# Patient Record
Sex: Male | Born: 1995 | Race: White | Hispanic: No | Marital: Single | Smoking: Former smoker
Health system: Southern US, Community
[De-identification: ages and names within clinical notes are randomized; demographics above are authoritative.]

## PROBLEM LIST (undated history)

## (undated) DIAGNOSIS — F909 Attention-deficit hyperactivity disorder, unspecified type: Secondary | ICD-10-CM

## (undated) DIAGNOSIS — K859 Acute pancreatitis without necrosis or infection, unspecified: Secondary | ICD-10-CM

## (undated) DIAGNOSIS — K5190 Ulcerative colitis, unspecified, without complications: Secondary | ICD-10-CM

## (undated) HISTORY — DX: Acute pancreatitis without necrosis or infection, unspecified: K85.90

---
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
## 2023-05-27 ENCOUNTER — Other Ambulatory Visit: Payer: Self-pay

## 2023-05-27 ENCOUNTER — Encounter (HOSPITAL_COMMUNITY): Payer: Self-pay

## 2023-05-27 ENCOUNTER — Emergency Department (HOSPITAL_COMMUNITY)
Admission: EM | Admit: 2023-05-27 | Discharge: 2023-05-27 | Disposition: A | Discharge status: Home / Self Care | Attending: Emergency Medicine | Admitting: Emergency Medicine

## 2023-05-27 DIAGNOSIS — L03211 Cellulitis of face: Secondary | ICD-10-CM | POA: Insufficient documentation

## 2023-05-27 DIAGNOSIS — L0201 Cutaneous abscess of face: Secondary | ICD-10-CM | POA: Insufficient documentation

## 2023-05-27 DIAGNOSIS — K130 Diseases of lips: Secondary | ICD-10-CM | POA: Diagnosis present

## 2023-05-27 DIAGNOSIS — L0291 Cutaneous abscess, unspecified: Secondary | ICD-10-CM

## 2023-05-27 MED ORDER — LIDOCAINE-PRILOCAINE 2.5-2.5 % EX CREA
TOPICAL_CREAM | Freq: Once | CUTANEOUS | Status: AC
Start: 2023-05-27 — End: 2023-05-27
  Administered 2023-05-27: 1 via TOPICAL
  Filled 2023-05-27: qty 5

## 2023-05-27 NOTE — Discharge Instructions (Signed)
You have an area of infected skin called cellulitis.  I have started you on antibiotics.  These antibiotics do not work instantaneously.   - You should allow at least 48 hours after your first dose for the cellulitis to slow it's spreading.   - You should allow 48 hours after the first dose to see the cellulitis to start improving.   - These things may happen quicker than that but allow at least these limitations prior to returning to the emergency department or your primary doctor.   - The exceptions to this are if you become systemically ill such as having a fever, nausea, vomiting, malaise.  - The other exception is if you have a rapid spreading of the cellulitis or the redness and swelling and pain.  If you notice more than a couple inches of spreading over a couple hours you need to return to the emergency department immediately because this could be a life-threatening infection. If you notice red streaking from the site you also need to return for reevaluation.  - If not, follow up with your primary doctor as instructed for reevaluation.   

## 2023-05-27 NOTE — ED Triage Notes (Signed)
Pt arrived via POV c/o persistent abscess/cyst to his lower lip that he first noticed yesterday. Pt reports he went to [facility] and was prescribed a PO Bactrim and an ointment. Pt reports he has taken 2 rounds of his prescription w/o any relief. Pt reports the cyst feels like a burning pressure.

## 2023-05-27 NOTE — ED Provider Notes (Signed)
[name] Provider Note   CSN: [id number] Arrival date & time: 05/27/23  0217     History  Chief Complaint  Patient presents with   Abscess    Bryan Charles is a 27 y.o. male.  27 year old male who presents ER today with swelling to his lower lip.  Patient states has been there for couple days he went to urgent care was started on some type of ointment along with p.o. Bactrim.  States has not really improved much.  He states he drained some pus from the earlier today that was yellow and thick.  No risk factors for herpes.   Abscess      Home Medications Prior to Admission medications   Medication Sig Start Date End Date Taking? Authorizing Provider  amphetamine-dextroamphetamine (ADDERALL) 15 MG tablet Take 15 mg by mouth daily. 04/25/23  Yes [provider]  mesalamine (CANASA) 1000 MG suppository Place 1,000 mg rectally at bedtime. 04/04/23  Yes [provider]  mesalamine (LIALDA) 1.2 g EC tablet Take 4.8 g by mouth daily. 04/18/23  Yes [provider]  mupirocin ointment (BACTROBAN) 2 % Apply 1 Application topically 2 (two) times daily. 05/26/23  Yes [provider]  sulfamethoxazole-trimethoprim (BACTRIM DS) 800-160 MG tablet Take 1 tablet by mouth 2 (two) times daily. 05/26/23  Yes [provider]  cephALEXin (KEFLEX) 500 MG capsule Take 2 capsules (1,000 mg total) by mouth 2 (two) times daily. 04/11/14   Burgess Amor, PA-C      Allergies    Patient has no known allergies.    Review of Systems   Review of Systems  Physical Exam Updated Vital Signs BP (!) 152/85 (BP Location: Right Arm)   Pulse 89   Temp 98.3 F (36.8 C) (Oral)   Resp 18   Ht 5' 10.5" (1.791 m)   Wt 92.1 kg   SpO2 99%   BMI 28.72 kg/m  Physical Exam Vitals and nursing note reviewed.  Constitutional:      Appearance: He is well-developed.  HENT:     Head: Normocephalic and atraumatic.     Comments:  Swelling, tenderness induration to his mid lower lip with a scab in the middle of it.  No fluctuance.  No swelling or changes intraorally.    Mouth/Throat:     Mouth: Mucous membranes are moist.     Pharynx: Oropharynx is clear.  Eyes:     Pupils: Pupils are equal, round, and reactive to light.  Cardiovascular:     Rate and Rhythm: Normal rate.  Pulmonary:     Effort: Pulmonary effort is normal. No respiratory distress.  Abdominal:     General: There is no distension.  Musculoskeletal:        General: Normal range of motion.     Cervical back: Normal range of motion.  Neurological:     General: No focal deficit present.     Mental Status: He is alert.     ED Results / Procedures / Treatments   Labs (all labs ordered are listed, but only abnormal results are displayed) Labs Reviewed - No data to display  EKG None  Radiology No results found.  Procedures .Marland KitchenIncision and Drainage  Date/Time: 05/27/2023 5:57 AM  Performed by: Marily Memos, MD Authorized by: Marily Memos, MD   Consent:    Consent obtained:  Verbal   Consent given by:  Patient   Risks discussed:  Bleeding, incomplete drainage and pain  Alternatives discussed:  No treatment Universal protocol:    Procedure explained and questions answered to patient or proxy's satisfaction: yes     Relevant documents present and verified: yes     Site/side marked: yes     Patient identity confirmed:  Verbally with patient Location:    Type:  Abscess   Size:  1   Location:  Head   Head location:  Face Pre-procedure details:    Skin preparation:  Chlorhexidine Sedation:    Sedation type:  None Anesthesia:    Anesthesia method:  Topical application   Topical anesthetic:  EMLA cream Procedure type:    Complexity:  Simple Procedure details:    Ultrasound guidance: no     Needle aspiration: no     Incision types:  Stab incision   Drainage amount:  Scant   Wound treatment:  Wound left open   Packing materials:   None Post-procedure details:    Procedure completion:  Tolerated Comments:     No obvious drainage     Medications Ordered in ED Medications  lidocaine-prilocaine (EMLA) cream (1 Application Topical Given 05/27/23 0405)    ED Course/ Medical Decision Making/ A&P                             Medical Decision Making Risk Prescription drug management.   Attempted I&D per above.  Some thin liquid came out along with some blood but no purulence.  Attempted to express any kind of fluid that might of been in there and not came out.  Afterwards a quick bedside ultrasound did not show any large fluid collection.  Suspect is just cellulitis with induration.  Advised him to continue his Bactrim.  Will return here for any new or worsening symptoms.  Final Clinical Impression(s) / ED Diagnoses Final diagnoses:  Cellulitis of face  Abscess    Rx / DC Orders ED Discharge Orders     None         Ambria Mayfield, Barbara Cower, MD 05/27/23 [phone]

## 2023-10-09 ENCOUNTER — Ambulatory Visit
Admission: EM | Admit: 2023-10-09 | Discharge: 2023-10-09 | Disposition: A | Discharge status: Home / Self Care | Attending: Nurse Practitioner | Admitting: Nurse Practitioner

## 2023-10-09 DIAGNOSIS — Z8739 Personal history of other diseases of the musculoskeletal system and connective tissue: Secondary | ICD-10-CM | POA: Diagnosis not present

## 2023-10-09 DIAGNOSIS — M546 Pain in thoracic spine: Secondary | ICD-10-CM | POA: Diagnosis not present

## 2023-10-09 MED ORDER — KETOROLAC TROMETHAMINE 30 MG/ML IJ SOLN
30.0000 mg | Freq: Once | INTRAMUSCULAR | Status: AC
  Administered 2023-10-09: 30 mg via INTRAMUSCULAR

## 2023-10-09 MED ORDER — DEXAMETHASONE SODIUM PHOSPHATE 10 MG/ML IJ SOLN
10.0000 mg | INTRAMUSCULAR | Status: AC
  Administered 2023-10-09: 10 mg via INTRAMUSCULAR

## 2023-10-09 MED ORDER — METHOCARBAMOL 500 MG PO TABS: 500.0000 mg | ORAL_TABLET | Freq: Two times a day (BID) | ORAL | 0 refills | Status: DC

## 2023-10-09 NOTE — ED Triage Notes (Signed)
Pt reports injuring his back while wrapping present , states he twisted wrong and then felt a pain immediately in the left mid back area  is unable to sit up straight due to pain.

## 2023-10-09 NOTE — ED Provider Notes (Signed)
[facility]    CSN: [id number] Arrival date & time: 10/09/23  1649      History   Chief Complaint No chief complaint on file.   HPI Bryan Charles is a 27 y.o. male.   The history is provided by the patient.   Patient presents for complaints of thoracic back pain that is been present for the past several hours.  Patient states he was not doing anything strenuous, states he was wrapping a present, and twisted wrong and felt the pain immediately in the left side of his mid back.  Patient states that he is unable to sit up straight or stand up.  He denies numbness or tingling to his upper extremities, loss of bowel or bladder function, or radiation of pain.  Patient reports prior history of back pain.  States that he has not taken any medication for his symptoms.  Past Medical History:  Diagnosis Date   Pancreatitis     There are no problems to display for this patient.   History reviewed. No pertinent surgical history.     Home Medications    Prior to Admission medications   Medication Sig Start Date End Date Taking? Authorizing Provider  methocarbamol (ROBAXIN) 500 MG tablet Take 1 tablet (500 mg total) by mouth 2 (two) times daily. 10/09/23  Yes Leath-Warren, Sadie Haber, NP  amphetamine-dextroamphetamine (ADDERALL) 15 MG tablet Take 15 mg by mouth daily. 04/25/23   [provider]  cephALEXin (KEFLEX) 500 MG capsule Take 2 capsules (1,000 mg total) by mouth 2 (two) times daily. 04/11/14   Burgess Amor, PA-C  mesalamine (CANASA) 1000 MG suppository Place 1,000 mg rectally at bedtime. 04/04/23   [provider]  mesalamine (LIALDA) 1.2 g EC tablet Take 4.8 g by mouth daily. 04/18/23   [provider]  mupirocin ointment (BACTROBAN) 2 % Apply 1 Application topically 2 (two) times daily. 05/26/23   [provider]  sulfamethoxazole-trimethoprim (BACTRIM DS) 800-160 MG tablet Take 1 tablet by mouth 2 (two) times daily. 05/26/23   [provider]    Family History History reviewed. No pertinent family history.  Social History Social History   Tobacco Use   Smoking status: Former    Current packs/day: 0.00    Types: Cigarettes    Quit date: 05/21/2011    Years since quitting: 12.3  Vaping Use   Vaping status: Never Used  Substance Use Topics   Alcohol use: No   Drug use: No     Allergies   Patient has no known allergies.   Review of Systems Review of Systems Per HPI  Physical Exam Triage Vital Signs ED Triage Vitals  Encounter Vitals Group     BP 10/09/23 1657 129/81     Systolic BP Percentile --      Diastolic BP Percentile --      Pulse Rate 10/09/23 1657 74     Resp 10/09/23 1657 18     Temp 10/09/23 1657 98.1 F (36.7 C)     Temp Source 10/09/23 1657 Oral     SpO2 10/09/23 1657 98 %     Weight --      Height --      Head Circumference --      Peak Flow --      Pain Score 10/09/23 1658 8     Pain Loc --      Pain Education --      Exclude from Growth Chart --  No data found.  Updated Vital Signs BP 129/81 (BP Location: Right Arm)   Pulse 74   Temp 98.1 F (36.7 C) (Oral)   Resp 18   SpO2 98%   Visual Acuity Right Eye Distance:   Left Eye Distance:   Bilateral Distance:    Right Eye Near:   Left Eye Near:    Bilateral Near:     Physical Exam Vitals and nursing note reviewed.  Constitutional:      General: He is not in acute distress.    Appearance: Normal appearance.  HENT:     Head: Normocephalic.  Eyes:     Extraocular Movements: Extraocular movements intact.     Pupils: Pupils are equal, round, and reactive to light.  Cardiovascular:     Rate and Rhythm: Normal rate and regular rhythm.     Pulses: Normal pulses.     Heart sounds: Normal heart sounds.  Pulmonary:     Effort: Pulmonary effort is normal. No respiratory distress.     Breath sounds: Normal breath sounds. No stridor. No wheezing, rhonchi or rales.  Abdominal:     General: Bowel sounds are  normal.     Palpations: Abdomen is soft.     Tenderness: There is no abdominal tenderness.  Musculoskeletal:     Cervical back: Normal.     Thoracic back: Spasms present. No swelling, deformity, signs of trauma or tenderness. Decreased range of motion.     Lumbar back: Normal.       Back:  Skin:    General: Skin is warm and dry.  Neurological:     General: No focal deficit present.     Mental Status: He is alert and oriented to person, place, and time.  Psychiatric:        Mood and Affect: Mood normal.        Behavior: Behavior normal.      UC Treatments / Results  Labs (all labs ordered are listed, but only abnormal results are displayed) Labs Reviewed - No data to display  EKG   Radiology No results found.  Procedures Procedures (including critical care time)  Medications Ordered in UC Medications  ketorolac (TORADOL) 30 MG/ML injection 30 mg (30 mg Intramuscular Given 10/09/23 1736)  dexamethasone (DECADRON) injection 10 mg (10 mg Intramuscular Given 10/09/23 1736)    Initial Impression / Assessment and Plan / UC Course  I have reviewed the triage vital signs and the nursing notes.  Pertinent labs & imaging results that were available during my care of the patient were reviewed by me and considered in my medical decision making (see chart for details).  Patient presents with left-sided thoracic back pain, symptoms consistent with a possible muscle strain versus back spasm.  Toradol 30 mg IM and Decadron 10 mg IM administered for pain and inflammation.  Methocarbamol 500 mg prescribed for spasm.  Supportive care recommendations were provided and discussed with the patient to include over-the-counter analgesics, the use of ice or heat, and staying active along with stretching exercises.  Patient was given strict ER follow-up precautions.  Patient was advised to follow-up with his primary care physician or with orthopedics if symptoms fail to improve or for worsening.   Patient was in agreement with this plan of care and verbalized understanding.  All questions were answered.  Patient stable for discharge.  Final Clinical Impressions(s) / UC Diagnoses   Final diagnoses:  Left-sided thoracic back pain, unspecified chronicity  History of back pain  Discharge Instructions      You have been given injections of Decadron 10 mg and Toradol 30 mg.  Do not take any additional ibuprofen today.  Recommend Tylenol arthritis strength 650 mg tablets as directed for pain. Take medication as prescribed. Try to remain as active as possible. Gentle range of motion and stretching exercises to help with back spasm and pain. May apply ice or heat as needed.  Ice is recommended for pain or swelling, heat for spasm or stiffness.  Apply for 20 minutes, remove for 1 hour, then repeat. May take over-the-counter Tylenol extra strength 500 mg tablet approximately 1 -2 hours after taking ibuprofen for breakthrough pain. Go to the emergency department immediately if you develop weakness in your legs or feet, numbness, tingling, or weakness in your upper extremities, inability to walk, loss of bowel or bladder function, difficulty urinating or passing a bowel movement, or other concerns. Follow-up with your primary care physician or with orthopedics if your symptoms do not improve.  Follow-up as needed.     ED Prescriptions     Medication Sig Dispense Auth. Provider   methocarbamol (ROBAXIN) 500 MG tablet Take 1 tablet (500 mg total) by mouth 2 (two) times daily. 20 tablet Leath-Warren, Sadie Haber, NP      PDMP not reviewed this encounter.   Abran Cantor, NP 10/09/23 1739

## 2023-10-09 NOTE — Discharge Instructions (Addendum)
You have been given injections of Decadron 10 mg and Toradol 30 mg.  Do not take any additional ibuprofen today.  Recommend Tylenol arthritis strength 650 mg tablets as directed for pain. Take medication as prescribed. Try to remain as active as possible. Gentle range of motion and stretching exercises to help with back spasm and pain. May apply ice or heat as needed.  Ice is recommended for pain or swelling, heat for spasm or stiffness.  Apply for 20 minutes, remove for 1 hour, then repeat. May take over-the-counter Tylenol extra strength 500 mg tablet approximately 1 -2 hours after taking ibuprofen for breakthrough pain. Go to the emergency department immediately if you develop weakness in your legs or feet, numbness, tingling, or weakness in your upper extremities, inability to walk, loss of bowel or bladder function, difficulty urinating or passing a bowel movement, or other concerns. Follow-up with your primary care physician or with orthopedics if your symptoms do not improve.  Follow-up as needed.

## 2024-07-20 ENCOUNTER — Ambulatory Visit
Admission: EM | Admit: 2024-07-20 | Discharge: 2024-07-20 | Disposition: A | Discharge status: Home / Self Care | Attending: Nurse Practitioner | Admitting: Nurse Practitioner

## 2024-07-20 ENCOUNTER — Encounter: Payer: Self-pay | Admitting: Emergency Medicine

## 2024-07-20 DIAGNOSIS — Z8739 Personal history of other diseases of the musculoskeletal system and connective tissue: Secondary | ICD-10-CM | POA: Diagnosis not present

## 2024-07-20 DIAGNOSIS — S46912A Strain of unspecified muscle, fascia and tendon at shoulder and upper arm level, left arm, initial encounter: Secondary | ICD-10-CM

## 2024-07-20 HISTORY — DX: Attention-deficit hyperactivity disorder, unspecified type: F90.9

## 2024-07-20 HISTORY — DX: Ulcerative colitis, unspecified, without complications: K51.90

## 2024-07-20 MED ORDER — KETOROLAC TROMETHAMINE 30 MG/ML IJ SOLN
30.0000 mg | Freq: Once | INTRAMUSCULAR | Status: AC
Start: 2024-07-20 — End: 2024-07-20
  Administered 2024-07-20: 30 mg via INTRAMUSCULAR

## 2024-07-20 MED ORDER — PREDNISONE 20 MG PO TABS: 40.0000 mg | ORAL_TABLET | Freq: Every day | ORAL | 0 refills | Status: AC

## 2024-07-20 MED ORDER — METHOCARBAMOL 500 MG PO TABS: 500.0000 mg | ORAL_TABLET | Freq: Two times a day (BID) | ORAL | 0 refills | Status: AC

## 2024-07-20 MED ORDER — DEXAMETHASONE SODIUM PHOSPHATE 10 MG/ML IJ SOLN
10.0000 mg | INTRAMUSCULAR | Status: AC
  Administered 2024-07-20: 10 mg via INTRAMUSCULAR

## 2024-07-20 NOTE — ED Provider Notes (Signed)
 [facility]    CSN: [id number] Arrival date & time: 07/20/24  1045      History   Chief Complaint Chief Complaint  Patient presents with   left shoulder pain    HPI Bryan Charles is a 28 y.o. male.   The history is provided by the patient.   Patient presents for complaints of pain to the left side of his mid back..  Patient states the pain is in the left shoulder and radiates up to the left neck.  He states he was working on his truck prior to his symptoms starting approximately 4 days ago.  He denies numbness, tingling, decreased range of motion in the shoulder, or radiation of pain into the left arm.  So far, states he has been taking Tylenol for his symptoms.  Per review of the chart, patient with prior history of the same or similar symptoms.  Past Medical History:  Diagnosis Date   ADHD    Pancreatitis    Ulcerative colitis (HCC)     There are no active problems to display for this patient.   History reviewed. No pertinent surgical history.     Home Medications    Prior to Admission medications   Medication Sig Start Date End Date Taking? Authorizing Provider  amphetamine-dextroamphetamine (ADDERALL) 15 MG tablet Take 15 mg by mouth daily. 04/25/23   [provider]  mesalamine (CANASA) 1000 MG suppository Place 1,000 mg rectally at bedtime. 04/04/23   [provider]  mesalamine (LIALDA) 1.2 g EC tablet Take 4.8 g by mouth daily. 04/18/23   [provider]    Family History History reviewed. No pertinent family history.  Social History Social History   Tobacco Use   Smoking status: Former    Current packs/day: 0.00    Types: Cigarettes    Quit date: 05/21/2011    Years since quitting: 13.1   Smokeless tobacco: Current  Vaping Use   Vaping status: Never Used  Substance Use Topics   Alcohol use: No   Drug use: No     Allergies   Patient has no known allergies.   Review of Systems Review of Systems Per  HPI  Physical Exam Triage Vital Signs ED Triage Vitals  Encounter Vitals Group     BP 07/20/24 1056 125/71     Girls Systolic BP Percentile --      Girls Diastolic BP Percentile --      Boys Systolic BP Percentile --      Boys Diastolic BP Percentile --      Pulse Rate 07/20/24 1056 80     Resp 07/20/24 1056 18     Temp 07/20/24 1056 (!) 97.5 F (36.4 C)     Temp Source 07/20/24 1056 Oral     SpO2 07/20/24 1056 99 %     Weight --      Height --      Head Circumference --      Peak Flow --      Pain Score 07/20/24 1057 8     Pain Loc --      Pain Education --      Exclude from Growth Chart --    No data found.  Updated Vital Signs BP 125/71 (BP Location: Right Arm)   Pulse 80   Temp (!) 97.5 F (36.4 C) (Oral)   Resp 18   SpO2 99%   Visual Acuity Right Eye Distance:   Left Eye Distance:  Bilateral Distance:    Right Eye Near:   Left Eye Near:    Bilateral Near:     Physical Exam Vitals and nursing note reviewed.  Constitutional:      General: He is not in acute distress.    Appearance: Normal appearance.  HENT:     Head: Normocephalic.  Eyes:     Extraocular Movements: Extraocular movements intact.     Pupils: Pupils are equal, round, and reactive to light.  Cardiovascular:     Rate and Rhythm: Normal rate and regular rhythm.     Pulses: Normal pulses.     Heart sounds: Normal heart sounds.  Pulmonary:     Effort: Pulmonary effort is normal.     Breath sounds: Normal breath sounds.  Musculoskeletal:       Arms:     Cervical back: Decreased range of motion.  Skin:    General: Skin is warm and dry.  Neurological:     General: No focal deficit present.     Mental Status: He is alert and oriented to person, place, and time.  Psychiatric:        Mood and Affect: Mood normal.        Behavior: Behavior normal.      UC Treatments / Results  Labs (all labs ordered are listed, but only abnormal results are displayed) Labs Reviewed - No data to  display  EKG   Radiology No results found.  Procedures Procedures (including critical care time)  Medications Ordered in UC Medications  ketorolac  (TORADOL ) 30 MG/ML injection 30 mg (has no administration in time range)  dexamethasone  (DECADRON ) injection 10 mg (has no administration in time range)    Initial Impression / Assessment and Plan / UC Course  I have reviewed the triage vital signs and the nursing notes.  Pertinent labs & imaging results that were available during my care of the patient were reviewed by me and considered in my medical decision making (see chart for details).  Symptoms consistent with a muscle strain in the left shoulder.  Will treat with Decadron  10 mg IM and Toradol  30 mg IM today.  Will start patient on prednisone  40 mg for the next 5 days along with methocarbamol  as a muscle relaxer.  Supportive care recommendations were provided and discussed with the patient to include the use of ice or heat, and stretching exercises.  Patient advised he can continue Tylenol as needed.  Discussed indications with patient regarding follow-up.  Patient was in agreement with this plan of care and verbalizes understanding.  All questions were answered.  Patient stable for discharge.   Final Clinical Impressions(s) / UC Diagnoses   Final diagnoses:  None   Discharge Instructions   None    ED Prescriptions   None    PDMP not reviewed this encounter.   Gilmer Etta PARAS, NP 07/20/24 1115

## 2024-07-20 NOTE — ED Triage Notes (Signed)
 Left shoulder pain that runs to neck x 4 days.  Pain started after working on his truck.

## 2024-07-20 NOTE — Discharge Instructions (Signed)
 Your symptoms are consistent with a muscle strain.  You have been given an injection of Decadron  10 mg and Toradol  30 mg today.  Start the prednisone  tomorrow. Take medication as prescribed. You may continue Tylenol as needed for pain or discomfort. Recommend the use of ice or heat.  Apply ice for pain or swelling, heat for spasm or stiffness.  Apply for 20 minutes, remove for 1 hour, repeat as needed. Gentle stretching and range of motion exercises are helpful to decrease your recovery time. If symptoms fail to improve, recommend follow-up with your primary care physician for further evaluation. Follow-up as needed.
